# Patient Record
Sex: Female | Born: 1955 | Race: White | Hispanic: No | Marital: Single | Smoking: Former smoker
Health system: Southern US, Community
[De-identification: ages and names within clinical notes are randomized; demographics above are authoritative.]

## PROBLEM LIST (undated history)

## (undated) DIAGNOSIS — Z8709 Personal history of other diseases of the respiratory system: Secondary | ICD-10-CM

## (undated) DIAGNOSIS — M1990 Unspecified osteoarthritis, unspecified site: Secondary | ICD-10-CM

## (undated) DIAGNOSIS — F419 Anxiety disorder, unspecified: Secondary | ICD-10-CM

## (undated) DIAGNOSIS — K649 Unspecified hemorrhoids: Secondary | ICD-10-CM

## (undated) DIAGNOSIS — E786 Lipoprotein deficiency: Secondary | ICD-10-CM

## (undated) DIAGNOSIS — R0600 Dyspnea, unspecified: Secondary | ICD-10-CM

## (undated) HISTORY — PX: COLONOSCOPY: SHX174

## (undated) HISTORY — DX: Personal history of other diseases of the respiratory system: Z87.09

## (undated) HISTORY — PX: APPENDECTOMY: SHX54

## (undated) HISTORY — DX: Unspecified hemorrhoids: K64.9

## (undated) HISTORY — DX: Lipoprotein deficiency: E78.6

---

## 2001-05-12 ENCOUNTER — Other Ambulatory Visit: Admission: RE | Admit: 2001-05-12 | Discharge: 2001-05-12 | Payer: Self-pay | Admitting: Family Medicine

## 2005-03-08 HISTORY — PX: DILATION AND CURETTAGE OF UTERUS: SHX78

## 2005-03-08 HISTORY — PX: ENDOMETRIAL ABLATION: SHX621

## 2010-01-14 ENCOUNTER — Other Ambulatory Visit: Admission: RE | Admit: 2010-01-14 | Discharge: 2010-01-14 | Payer: Self-pay | Admitting: Family Medicine

## 2011-10-08 ENCOUNTER — Other Ambulatory Visit: Payer: Self-pay | Admitting: Family Medicine

## 2011-10-08 ENCOUNTER — Ambulatory Visit
Admission: RE | Admit: 2011-10-08 | Discharge: 2011-10-08 | Disposition: A | Discharge status: Home / Self Care | Payer: 59 | Source: Ambulatory Visit | Attending: Family Medicine | Admitting: Family Medicine

## 2011-10-08 DIAGNOSIS — R1011 Right upper quadrant pain: Secondary | ICD-10-CM

## 2014-04-06 ENCOUNTER — Encounter: Payer: Self-pay | Admitting: *Deleted

## 2016-03-08 HISTORY — PX: BREAST BIOPSY: SHX20

## 2016-03-15 ENCOUNTER — Other Ambulatory Visit: Payer: Self-pay | Admitting: Family Medicine

## 2016-03-15 ENCOUNTER — Other Ambulatory Visit (HOSPITAL_COMMUNITY)
Admission: RE | Admit: 2016-03-15 | Discharge: 2016-03-15 | Disposition: A | Discharge status: Home / Self Care | Payer: 59 | Source: Ambulatory Visit | Attending: Family Medicine | Admitting: Family Medicine

## 2016-03-15 DIAGNOSIS — R7309 Other abnormal glucose: Secondary | ICD-10-CM | POA: Diagnosis not present

## 2016-03-15 DIAGNOSIS — Z1151 Encounter for screening for human papillomavirus (HPV): Secondary | ICD-10-CM | POA: Diagnosis not present

## 2016-03-15 DIAGNOSIS — Z124 Encounter for screening for malignant neoplasm of cervix: Secondary | ICD-10-CM | POA: Diagnosis not present

## 2016-03-15 DIAGNOSIS — E559 Vitamin D deficiency, unspecified: Secondary | ICD-10-CM | POA: Diagnosis not present

## 2016-03-15 DIAGNOSIS — E785 Hyperlipidemia, unspecified: Secondary | ICD-10-CM | POA: Diagnosis not present

## 2016-03-15 DIAGNOSIS — Z0000 Encounter for general adult medical examination without abnormal findings: Secondary | ICD-10-CM | POA: Diagnosis not present

## 2016-03-16 ENCOUNTER — Ambulatory Visit
Admission: RE | Admit: 2016-03-16 | Discharge: 2016-03-16 | Disposition: A | Discharge status: Home / Self Care | Payer: 59 | Source: Ambulatory Visit | Attending: Family Medicine | Admitting: Family Medicine

## 2016-03-16 ENCOUNTER — Other Ambulatory Visit: Payer: Self-pay | Admitting: Family Medicine

## 2016-03-16 DIAGNOSIS — R222 Localized swelling, mass and lump, trunk: Secondary | ICD-10-CM | POA: Diagnosis not present

## 2016-03-16 DIAGNOSIS — R229 Localized swelling, mass and lump, unspecified: Secondary | ICD-10-CM

## 2016-03-17 ENCOUNTER — Other Ambulatory Visit: Payer: Self-pay | Admitting: Family Medicine

## 2016-03-17 DIAGNOSIS — N63 Unspecified lump in unspecified breast: Secondary | ICD-10-CM

## 2016-03-19 LAB — CYTOLOGY - PAP: HPV: NOT DETECTED

## 2016-03-23 ENCOUNTER — Ambulatory Visit
Admission: RE | Admit: 2016-03-23 | Discharge: 2016-03-23 | Disposition: A | Discharge status: Home / Self Care | Payer: 59 | Source: Ambulatory Visit | Attending: Family Medicine | Admitting: Family Medicine

## 2016-03-23 ENCOUNTER — Other Ambulatory Visit: Payer: Self-pay | Admitting: Family Medicine

## 2016-03-23 DIAGNOSIS — R921 Mammographic calcification found on diagnostic imaging of breast: Secondary | ICD-10-CM

## 2016-03-23 DIAGNOSIS — N63 Unspecified lump in unspecified breast: Secondary | ICD-10-CM

## 2016-03-23 DIAGNOSIS — N6489 Other specified disorders of breast: Secondary | ICD-10-CM | POA: Diagnosis not present

## 2016-03-30 ENCOUNTER — Ambulatory Visit
Admission: RE | Admit: 2016-03-30 | Discharge: 2016-03-30 | Disposition: A | Discharge status: Home / Self Care | Payer: 59 | Source: Ambulatory Visit | Attending: Family Medicine | Admitting: Family Medicine

## 2016-03-30 ENCOUNTER — Other Ambulatory Visit: Payer: Self-pay | Admitting: Family Medicine

## 2016-03-30 DIAGNOSIS — R921 Mammographic calcification found on diagnostic imaging of breast: Secondary | ICD-10-CM

## 2016-03-30 DIAGNOSIS — N6012 Diffuse cystic mastopathy of left breast: Secondary | ICD-10-CM | POA: Diagnosis not present

## 2016-04-02 ENCOUNTER — Other Ambulatory Visit: Payer: Self-pay | Admitting: Obstetrics & Gynecology

## 2016-04-02 DIAGNOSIS — N72 Inflammatory disease of cervix uteri: Secondary | ICD-10-CM | POA: Diagnosis not present

## 2016-04-02 DIAGNOSIS — R87619 Unspecified abnormal cytological findings in specimens from cervix uteri: Secondary | ICD-10-CM | POA: Diagnosis not present

## 2016-04-29 DIAGNOSIS — R87619 Unspecified abnormal cytological findings in specimens from cervix uteri: Secondary | ICD-10-CM | POA: Diagnosis not present

## 2016-05-21 ENCOUNTER — Encounter (HOSPITAL_COMMUNITY): Payer: Self-pay | Admitting: *Deleted

## 2016-05-24 DIAGNOSIS — R87619 Unspecified abnormal cytological findings in specimens from cervix uteri: Secondary | ICD-10-CM | POA: Diagnosis not present

## 2016-05-24 DIAGNOSIS — Z01818 Encounter for other preprocedural examination: Secondary | ICD-10-CM | POA: Diagnosis not present

## 2016-05-24 NOTE — Patient Instructions (Signed)
Your procedure is scheduled on:  Wednesday, June 02, 2016  [facility] at:  6:00 AM  Pick up the phone at the desk and dial [phone].  Call this number if you have problems the morning of surgery: [phone].  Remember: Do NOT eat food or drink after:  Midnight Tuesday  Take these medicines the morning of surgery with a SIP OF WATER:  None  Stop ALL herbal medications and Ibuprofen at this time  Do NOT smoke the day of surgery.  Do NOT wear jewelry (body piercing), metal hair clips/bobby pins, make-up, or nail polish. Do NOT wear lotions, powders, or perfumes.  You may wear deodorant. Do NOT shave for 48 hours prior to surgery. Do NOT bring valuables to the hospital. Contacts, dentures, or bridgework may not be worn into surgery.  Have a responsible adult drive you home and stay with you for 24 hours after your procedure  Bring a copy of your healthcare power of attorney and living will documents.

## 2016-05-25 ENCOUNTER — Encounter (HOSPITAL_COMMUNITY): Payer: Self-pay

## 2016-05-25 ENCOUNTER — Encounter (HOSPITAL_COMMUNITY)
Admission: RE | Admit: 2016-05-25 | Discharge: 2016-05-25 | Disposition: A | Discharge status: Home / Self Care | Payer: 59 | Source: Ambulatory Visit | Attending: Obstetrics & Gynecology | Admitting: Obstetrics & Gynecology

## 2016-05-25 DIAGNOSIS — R87619 Unspecified abnormal cytological findings in specimens from cervix uteri: Secondary | ICD-10-CM | POA: Insufficient documentation

## 2016-05-25 DIAGNOSIS — Z01812 Encounter for preprocedural laboratory examination: Secondary | ICD-10-CM | POA: Diagnosis present

## 2016-05-25 HISTORY — DX: Unspecified osteoarthritis, unspecified site: M19.90

## 2016-05-25 HISTORY — DX: Dyspnea, unspecified: R06.00

## 2016-05-25 HISTORY — DX: Anxiety disorder, unspecified: F41.9

## 2016-05-25 LAB — BASIC METABOLIC PANEL
ANION GAP: 7 (ref 5–15)
BUN: 16 mg/dL (ref 6–20)
CALCIUM: 9.4 mg/dL (ref 8.9–10.3)
CO2: 27 mmol/L (ref 22–32)
CREATININE: 0.64 mg/dL (ref 0.44–1.00)
Chloride: 107 mmol/L (ref 101–111)
Glucose, Bld: 103 mg/dL — ABNORMAL HIGH (ref 65–99)
Potassium: 4.1 mmol/L (ref 3.5–5.1)
SODIUM: 141 mmol/L (ref 135–145)

## 2016-05-25 LAB — CBC
HCT: 41.3 % (ref 36.0–46.0)
Hemoglobin: 13.4 g/dL (ref 12.0–15.0)
MCH: 30.7 pg (ref 26.0–34.0)
MCHC: 32.4 g/dL (ref 30.0–36.0)
MCV: 94.7 fL (ref 78.0–100.0)
PLATELETS: 266 10*3/uL (ref 150–400)
RBC: 4.36 MIL/uL (ref 3.87–5.11)
RDW: 13.2 % (ref 11.5–15.5)
WBC: 6.5 10*3/uL (ref 4.0–10.5)

## 2016-05-30 NOTE — H&P (Signed)
61yo PM female who [facility] due to AGC on 03/15/2016 and inability to obtain endometrial biopsy. Unfortunately EMB was not able to be collected and an US was performed that showed: 5.7cm uterus with thin lining- 0.2cm. Tiny hyperechoic area with shadowing- 0.5x0.4cm- ?calcification, avascular. Normal ovaries bilaterally. Unable to complete SHG. Today she denies any vaginal bleeding, discharge, itching or irritation. No pelvic or abdominal pain. No acute complaints or changes from her prior visit.   Current Medications  Taking   Ibuprofen 200 MG Tablet 2 tablets with food or milk as needed Orally every 6 hrs as needed for pain   Medication List reviewed and reconciled with the patient    Past Medical History  H/o recurrent bronchitis.   Hypoercholesterolemia.   Hemorrhoids, seen on colonoscopy.   Soft tissue knot left forehead since childhood.           Surgical History  appendectomy   D&C 07  endometrial ablation 07  breast biopsy, benign fibrocystic change 1/18   Family History  Father: deceased 7073 yrs, diabetes, heart attack, cholesterol--died of massive MI, had an enlarged heart, HH, gout,   Mother: alive 1782 yrs, OA, fibroids, ? female cancer  Paternal Grand Father: deceased, heart attack  Paternal Grand Mother: deceased, diabetes  Maternal Grand Father: deceased, COPD  Maternal Grand Mother: deceased, heart attack  Brother 1: deceased 50's yrs, Heart attack  Brother2: alive  Sister 1: alive  2 brother(s) , 1 sister(s) .   no family hx colon cancer, colon polyps or liver disease.   Social History  General:  Tobacco use  cigarettes: Former smoker Quit in year 2011 Pack-year Hx: 15 Tobacco history last updated 05/24/2016 no EXPOSURE TO PASSIVE SMOKE.  Alcohol: yes, maybe 1 wine or been once a week at most.  no Recreational drug use.  Exercise: yes, walks, daily 2-4 miles daily, some hiking, as weather allows.  Marital Status: single, no relationship  currently, previous female partners.  Children: none.  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  Religion: Christian--[facility] [address]  Seat belt use: yes.  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    Gyn History  Sexual activity not currently sexually active.  Periods : postmenopausal.  Last pap smear date 03/15/16 - [facility].  Last mammogram date 03/30/16 mammogram with left breast bx.  Abnormal pap smear 03/15/16 - [facility] late 40's or 50's-abnormal pap.  Denies H/O STD.  Menarche 10.    OB History  Never been pregnant per patient.    Allergies  Codeine Sulfate: nausea   Hospitalization/Major Diagnostic Procedure  appendectomy    Review of Systems  CONSTITUTIONAL:  no Chills. no Fever. no Night sweats.  HEENT:  Blurrred vision no. no Double vision.  CARDIOLOGY:  no Chest pain.  RESPIRATORY:  no Shortness of breath. no Cough.  UROLOGY:  no Urinary frequency. no Urinary incontinence. no Urinary urgency.  GASTROENTEROLOGY:  no Abdominal pain. no Appetite change. no Change in bowel movements.  FEMALE REPRODUCTIVE:  no Breast lumps or discharge. no Breast pain.  NEUROLOGY:  no Dizziness. no Headache. no Loss of consciousness.  PSYCHOLOGY:  no Anxiety. no Depression.  SKIN:  no Rash. no Hives.  HEMATOLOGY/LYMPH:  no Anemia. no Fatigue. Using Blood Thinners no.     Performed in office Vital Signs  Wt 188, Wt change -2 lb, Ht 61, BMI 35.52, Pulse sitting 77, BP sitting 132/77.  Examination  General Examination: GENERAL APPEARANCE well developed, well nourished .  SKIN: warm and dry, no rashes .  NECK: supple, normal appearance .  LUNGS: clear to auscultation bilaterally, no wheezes, rhonchi, rales.  HEART: no murmurs, regular rate and rhythm.  ABDOMEN: soft and not tender, no rebound, no rigidity.  MUSCULOSKELETAL no calf tenderness bilaterally .  EXTREMITIES: no edema present .  PSYCH:  appropriate mood and affect .     A/P: 61yo PM female who presents for hysteroscopy, D&C -NPO -LR @ 125cc/hr -SCDs to OR -no antibiotics indicated -Risk/benefits reviewed with patient including but not limited to risk of bleeding, infection, and injury to surrounding organs including uterine perforation.  Question and concerns were addressed and pt wishes to proceed.  Myna Hidalgo, DO [phone] (pager) [phone] (office)

## 2016-06-01 NOTE — Anesthesia Preprocedure Evaluation (Addendum)
Anesthesia Evaluation  Patient identified by MRN, date of birth, ID band Patient awake    Reviewed: Allergy & Precautions, NPO status , Patient's Chart, lab work & pertinent test results  Airway Mallampati: II  TM Distance: >3 FB Neck ROM: Full    Dental no notable dental hx.    Pulmonary neg pulmonary ROS, former smoker,    Pulmonary exam normal breath sounds clear to auscultation       Cardiovascular negative cardio ROS Normal cardiovascular exam Rhythm:Regular Rate:Normal     Neuro/Psych negative neurological ROS  negative psych ROS   GI/Hepatic negative GI ROS, Neg liver ROS,   Endo/Other  negative endocrine ROS  Renal/GU negative Renal ROS  negative genitourinary   Musculoskeletal negative musculoskeletal ROS (+)   Abdominal   Peds negative pediatric ROS (+)  Hematology negative hematology ROS (+)   Anesthesia Other Findings   Reproductive/Obstetrics negative OB ROS                            Anesthesia Physical Anesthesia Plan  ASA: II  Anesthesia Plan: General   Post-op Pain Management:    Induction: Intravenous  Airway Management Planned: LMA  Additional Equipment:   Intra-op Plan:   Post-operative Plan: Extubation in OR  Informed Consent: I have reviewed the patients History and Physical, chart, labs and discussed the procedure including the risks, benefits and alternatives for the proposed anesthesia with the patient or authorized representative who has indicated his/her understanding and acceptance.   Dental advisory given  Plan Discussed with: CRNA  Anesthesia Plan Comments:         Anesthesia Quick Evaluation  

## 2016-06-02 ENCOUNTER — Ambulatory Visit (HOSPITAL_COMMUNITY)
Admission: RE | Admit: 2016-06-02 | Discharge: 2016-06-02 | Disposition: A | Discharge status: Home / Self Care | Payer: 59 | Source: Ambulatory Visit | Attending: Obstetrics & Gynecology | Admitting: Obstetrics & Gynecology

## 2016-06-02 ENCOUNTER — Encounter (HOSPITAL_COMMUNITY)
Admission: RE | Disposition: A | Discharge status: Home / Self Care | Payer: Self-pay | Source: Ambulatory Visit | Attending: Obstetrics & Gynecology

## 2016-06-02 ENCOUNTER — Encounter (HOSPITAL_COMMUNITY): Payer: Self-pay

## 2016-06-02 ENCOUNTER — Ambulatory Visit (HOSPITAL_COMMUNITY): Payer: 59 | Admitting: Anesthesiology

## 2016-06-02 DIAGNOSIS — R87610 Atypical squamous cells of undetermined significance on cytologic smear of cervix (ASC-US): Secondary | ICD-10-CM | POA: Insufficient documentation

## 2016-06-02 DIAGNOSIS — Z87891 Personal history of nicotine dependence: Secondary | ICD-10-CM | POA: Insufficient documentation

## 2016-06-02 DIAGNOSIS — N882 Stricture and stenosis of cervix uteri: Secondary | ICD-10-CM | POA: Insufficient documentation

## 2016-06-02 DIAGNOSIS — R87619 Unspecified abnormal cytological findings in specimens from cervix uteri: Secondary | ICD-10-CM | POA: Diagnosis present

## 2016-06-02 HISTORY — PX: HYSTEROSCOPY WITH D & C: SHX1775

## 2016-06-02 SURGERY — DILATATION AND CURETTAGE /HYSTEROSCOPY
Anesthesia: General | Site: Vagina

## 2016-06-02 MED ORDER — LACTATED RINGERS IV SOLN
INTRAVENOUS | Status: DC
  Administered 2016-06-02: 07:00:00 via INTRAVENOUS

## 2016-06-02 MED ORDER — DEXAMETHASONE SODIUM PHOSPHATE 10 MG/ML IJ SOLN
INTRAMUSCULAR | Status: DC | PRN
  Administered 2016-06-02: 4 mg via INTRAVENOUS

## 2016-06-02 MED ORDER — PROPOFOL 10 MG/ML IV BOLUS
INTRAVENOUS | Status: DC | PRN
  Administered 2016-06-02: 170 mg via INTRAVENOUS

## 2016-06-02 MED ORDER — LACTATED RINGERS IV SOLN: INTRAVENOUS | Status: DC

## 2016-06-02 MED ORDER — LIDOCAINE HCL (CARDIAC) 20 MG/ML IV SOLN
INTRAVENOUS | Status: DC | PRN
  Administered 2016-06-02: 50 mg via INTRAVENOUS

## 2016-06-02 MED ORDER — FENTANYL CITRATE (PF) 100 MCG/2ML IJ SOLN
INTRAMUSCULAR | Status: AC
  Filled 2016-06-02: qty 2

## 2016-06-02 MED ORDER — FENTANYL CITRATE (PF) 100 MCG/2ML IJ SOLN
INTRAMUSCULAR | Status: DC | PRN
  Administered 2016-06-02 (×2): 25 ug via INTRAVENOUS
  Administered 2016-06-02: 50 ug via INTRAVENOUS

## 2016-06-02 MED ORDER — DEXAMETHASONE SODIUM PHOSPHATE 4 MG/ML IJ SOLN
INTRAMUSCULAR | Status: AC
Start: 2016-06-02 — End: ?
  Filled 2016-06-02: qty 1

## 2016-06-02 MED ORDER — ONDANSETRON HCL 4 MG/2ML IJ SOLN
INTRAMUSCULAR | Status: DC | PRN
  Administered 2016-06-02: 4 mg via INTRAVENOUS

## 2016-06-02 MED ORDER — MIDAZOLAM HCL 2 MG/2ML IJ SOLN
INTRAMUSCULAR | Status: AC
  Filled 2016-06-02: qty 2

## 2016-06-02 MED ORDER — SODIUM CHLORIDE 0.9 % IR SOLN
Status: DC | PRN
  Administered 2016-06-02: 3000 mL

## 2016-06-02 MED ORDER — KETOROLAC TROMETHAMINE 30 MG/ML IJ SOLN
INTRAMUSCULAR | Status: DC | PRN
  Administered 2016-06-02: 30 mg via INTRAVENOUS

## 2016-06-02 MED ORDER — SCOPOLAMINE 1 MG/3DAYS TD PT72
MEDICATED_PATCH | TRANSDERMAL | Status: AC
  Filled 2016-06-02: qty 1

## 2016-06-02 MED ORDER — SILVER NITRATE-POT NITRATE 75-25 % EX MISC
CUTANEOUS | Status: DC | PRN
  Administered 2016-06-02: 1

## 2016-06-02 MED ORDER — SCOPOLAMINE 1 MG/3DAYS TD PT72
1.0000 | MEDICATED_PATCH | Freq: Once | TRANSDERMAL | Status: DC
  Administered 2016-06-02: 1.5 mg via TRANSDERMAL

## 2016-06-02 MED ORDER — PROPOFOL 10 MG/ML IV BOLUS
INTRAVENOUS | Status: AC
  Filled 2016-06-02: qty 20

## 2016-06-02 MED ORDER — ONDANSETRON HCL 4 MG/2ML IJ SOLN
INTRAMUSCULAR | Status: AC
Start: 2016-06-02 — End: ?
  Filled 2016-06-02: qty 2

## 2016-06-02 MED ORDER — MIDAZOLAM HCL 2 MG/2ML IJ SOLN
INTRAMUSCULAR | Status: DC | PRN
  Administered 2016-06-02: 2 mg via INTRAVENOUS

## 2016-06-02 SURGICAL SUPPLY — 16 items
CANISTER SUCT 3000ML PPV (MISCELLANEOUS) ×2 IMPLANT
CATH ROBINSON RED A/P 16FR (CATHETERS) ×2 IMPLANT
CLOTH BEACON ORANGE TIMEOUT ST (SAFETY) ×2 IMPLANT
CONTAINER PREFILL 10% NBF 60ML (FORM) ×4 IMPLANT
DILATOR CANAL MILEX (MISCELLANEOUS) ×1 IMPLANT
GLOVE BIOGEL PI IND STRL 6.5 (GLOVE) ×1 IMPLANT
GLOVE BIOGEL PI IND STRL 7.0 (GLOVE) ×1 IMPLANT
GLOVE BIOGEL PI INDICATOR 6.5 (GLOVE) ×1
GLOVE BIOGEL PI INDICATOR 7.0 (GLOVE) ×1
GLOVE ECLIPSE 6.5 STRL STRAW (GLOVE) ×2 IMPLANT
GOWN STRL REUS W/TWL LRG LVL3 (GOWN DISPOSABLE) ×4 IMPLANT
PACK VAGINAL MINOR WOMEN LF (CUSTOM PROCEDURE TRAY) ×2 IMPLANT
PAD OB MATERNITY 4.3X12.25 (PERSONAL CARE ITEMS) ×2 IMPLANT
TOWEL OR 17X24 6PK STRL BLUE (TOWEL DISPOSABLE) ×4 IMPLANT
TUBING AQUILEX INFLOW (TUBING) ×2 IMPLANT
TUBING AQUILEX OUTFLOW (TUBING) ×2 IMPLANT

## 2016-06-02 NOTE — Op Note (Addendum)
Operative Report  PreOp: Atypical glandular cells PostOp: same Procedure:  Hysteroscopy, Dilation and Curettage Surgeon: Dr. Myna HidalgoJennifer Alixis Archer Anesthesia: General Complications:none EBL: Minimal (5cc) UOP: 50cc IVF:750cc Discrepancy: 70cc  Findings: 5cm anteverted uterus slightly thickened lining  Specimens: 1) endometrial curettings   Procedure: The patient was taken to the operating room where she underwent general anesthesia without difficulty. The patient was placed in a low lithotomy position using Allen stirrups. She was then prepped and draped in the normal sterile fashion. The bladder was drained using a red rubber urethral catheter. A sterile speculum was inserted into the vagina. A single tooth tenaculum was placed on the anterior lip of the cervix. The uterus was then sounded to 5cm. The endocervical canal was then serially dilated using Hank dilators.  The diagnostic hysteroscope was then inserted without difficulty and noted to have the findings as listed above. Visualization was achieved using NS as a distending medium. The hysteroscope was removed and sharp curettage was performed. The tissue was sent to pathology.   Upon completion, the hysteroscope was reinserted, no uterine perforation was seen. All instrument were then removed. Hemostasis was obtained at the cervical site using silver nitrate.  The patient was repositioned to the supine position. The patient tolerated the procedure without any complications and taken to recovery in stable condition.   Myna HidalgoJennifer Ami Mally, DO [phone] (pager) [phone] (office)

## 2016-06-02 NOTE — Anesthesia Postprocedure Evaluation (Signed)
Anesthesia Post Note  Patient: Donna Archer  Procedure(s) Performed: Procedure(s) (LRB): DILATATION AND CURETTAGE /HYSTEROSCOPY (N/A)  Patient location during evaluation: PACU Anesthesia Type: General Level of consciousness: awake and alert Pain management: pain level controlled Vital Signs Assessment: post-procedure vital signs reviewed and stable Respiratory status: spontaneous breathing, nonlabored ventilation, respiratory function stable and patient connected to nasal cannula oxygen Cardiovascular status: blood pressure returned to baseline and stable Postop Assessment: no signs of nausea or vomiting Anesthetic complications: no        Last Vitals:  Vitals:   06/02/16 0900 06/02/16 0954  BP: 119/69 138/89  Pulse: 72 68  Resp: (!) 21 20  Temp: 36.8 C     Last Pain:  Vitals:   06/02/16 0604  TempSrc: Oral   Pain Goal: Patients Stated Pain Goal: 4 (06/02/16 0604)               Phillips Groutarignan, Jerrion Tabbert

## 2016-06-02 NOTE — Transfer of Care (Signed)
Immediate Anesthesia Transfer of Care Note  Patient: Donna CheadleJocelyn Archer  Procedure(s) Performed: Procedure(s): DILATATION AND CURETTAGE /HYSTEROSCOPY (N/A)  Patient Location: PACU  Anesthesia Type:General  Level of Consciousness: awake, alert  and oriented  Airway & Oxygen Therapy: Patient Spontanous Breathing and Patient connected to nasal cannula oxygen  Post-op Assessment: Report given to RN and Post -op Vital signs reviewed and stable  Post vital signs: Reviewed and stable  Last Vitals:    Last Pain:  Vitals:   06/02/16 0604  TempSrc: Oral      Patients Stated Pain Goal: 4 (06/02/16 0604)  Complications: No apparent anesthesia complications

## 2016-06-02 NOTE — Interval H&P Note (Signed)
History and Physical Interval Note:  06/02/2016 6:36 AM  Delman CheadleJocelyn Sangalang  has presented today for surgery, with the diagnosis of R87.619 Atypical Cervical Glandular cells N88.2 Cervical Stenosis  The various methods of treatment have been discussed with the patient and family. After consideration of risks, benefits and other options for treatment, the patient has consented to  Procedure(s): DILATATION AND CURETTAGE /HYSTEROSCOPY (N/A) as a surgical intervention .  The patient's history has been reviewed, patient examined, no change in status, stable for surgery.  I have reviewed the patient's chart and labs.  Questions were answered to the patient's satisfaction.     Myna HidalgoZAN, Lovelle Deitrick, M

## 2016-06-02 NOTE — Discharge Instructions (Addendum)
HOME INSTRUCTIONS  Please note any unusual or excessive bleeding, pain, swelling. Mild dizziness or drowsiness are normal for about 24 hours after surgery.   Shower when comfortable  Restrictions: No driving for 24 hours or while taking pain medications.  Activity:  No heavy lifting (> 10 lbs), nothing in vagina (no tampons, douching, or intercourse) x 2 weeks; no tub baths for 2 weeks Vaginal spotting is expected but if your bleeding is heavy, period like,  please call the office  Diet:  You may return to your regular diet.  Do not eat large meals.  Eat small frequent meals throughout the day.  Continue to drink a good amount of water at least 6-8 glasses of water per day, hydration is very important for the healing process.    Post Anesthesia Home Care Instructions  Activity: Get plenty of rest for the remainder of the day. A responsible individual must stay with you for 24 hours following the procedure.  For the next 24 hours, DO NOT: -Drive a car -Advertising copywriterperate machinery -Drink alcoholic beverages -Take any medication unless instructed by your physician -Make any legal decisions or sign important papers.  Meals: Start with liquid foods such as gelatin or soup. Progress to regular foods as tolerated. Avoid greasy, spicy, heavy foods. If nausea and/or vomiting occur, drink only clear liquids until the nausea and/or vomiting subsides. Call your physician if vomiting continues.  Special Instructions/Symptoms: Your throat may feel dry or sore from the anesthesia or the breathing tube placed in your throat during surgery. If this causes discomfort, gargle with warm salt water. The discomfort should disappear within 24 hours.  If you had a scopolamine patch placed behind your ear for the management of post- operative nausea and/or vomiting:  1. The medication in the patch is effective for 72 hours, after which it should be removed.  Wrap patch in a tissue and discard in the trash. Wash hands  thoroughly with soap and water. 2. You may remove the patch earlier than 72 hours if you experience unpleasant side effects which may include dry mouth, dizziness or visual disturbances. 3. Avoid touching the patch. Wash your hands with soap and water after contact with the patch.     Pain Management: Take Motrin and/or Tylenol as needed for pain/discomfort.   Alcohol -- Avoid for 24 hours and while taking pain medications.  Nausea: Take sips of ginger ale or soda  Fever -- Call physician if temperature over 101 degrees  Follow up:  If you do not already have a follow up appointment scheduled, please call the office at [phone].  If you experience fever (a temperature greater than 100.4), pain unrelieved by pain medication, shortness of breath, swelling of a single leg, or any other symptoms which are concerning to you please the office immediately.

## 2016-06-02 NOTE — Anesthesia Procedure Notes (Signed)
Procedure Name: LMA Insertion Date/Time: 06/02/2016 7:37 AM Performed by: Cleda ClarksBROWDER, Merle Whitehorn R Pre-anesthesia Checklist: Patient identified, Emergency Drugs available, Suction available and Patient being monitored Patient Re-evaluated:Patient Re-evaluated prior to inductionOxygen Delivery Method: Circle system utilized Preoxygenation: Pre-oxygenation with 100% oxygen Intubation Type: IV induction Ventilation: Mask ventilation without difficulty LMA: LMA inserted LMA Size: 4.0 Number of attempts: 1 Placement Confirmation: positive ETCO2 Tube secured with: Tape Dental Injury: Teeth and Oropharynx as per pre-operative assessment

## 2016-06-02 NOTE — Addendum Note (Signed)
Addendum  created 06/02/16 1520 by Algis GreenhouseLinda A Siria Calandro, CRNA   Anesthesia Event edited

## 2016-06-03 ENCOUNTER — Encounter (HOSPITAL_COMMUNITY): Payer: Self-pay | Admitting: Obstetrics & Gynecology

## 2016-06-15 DIAGNOSIS — E785 Hyperlipidemia, unspecified: Secondary | ICD-10-CM | POA: Diagnosis not present

## 2016-07-01 DIAGNOSIS — R2242 Localized swelling, mass and lump, left lower limb: Secondary | ICD-10-CM | POA: Diagnosis not present

## 2016-07-26 ENCOUNTER — Other Ambulatory Visit: Payer: Self-pay | Admitting: General Surgery

## 2016-07-26 DIAGNOSIS — L720 Epidermal cyst: Secondary | ICD-10-CM | POA: Diagnosis not present

## 2016-07-26 DIAGNOSIS — L723 Sebaceous cyst: Secondary | ICD-10-CM | POA: Diagnosis not present

## 2016-08-13 DIAGNOSIS — Z4800 Encounter for change or removal of nonsurgical wound dressing: Secondary | ICD-10-CM | POA: Diagnosis not present

## 2016-08-13 DIAGNOSIS — R222 Localized swelling, mass and lump, trunk: Secondary | ICD-10-CM | POA: Diagnosis not present

## 2016-08-20 DIAGNOSIS — S50851A Superficial foreign body of right forearm, initial encounter: Secondary | ICD-10-CM | POA: Diagnosis not present

## 2016-10-14 DIAGNOSIS — Z0100 Encounter for examination of eyes and vision without abnormal findings: Secondary | ICD-10-CM | POA: Diagnosis not present

## 2016-11-29 ENCOUNTER — Other Ambulatory Visit: Payer: Self-pay | Admitting: Family Medicine

## 2016-11-29 DIAGNOSIS — N63 Unspecified lump in unspecified breast: Secondary | ICD-10-CM

## 2016-12-13 ENCOUNTER — Ambulatory Visit
Admission: RE | Admit: 2016-12-13 | Discharge: 2016-12-13 | Disposition: A | Discharge status: Home / Self Care | Payer: 59 | Source: Ambulatory Visit | Attending: Family Medicine | Admitting: Family Medicine

## 2016-12-13 DIAGNOSIS — N63 Unspecified lump in unspecified breast: Secondary | ICD-10-CM

## 2016-12-13 DIAGNOSIS — N6489 Other specified disorders of breast: Secondary | ICD-10-CM | POA: Diagnosis not present

## 2016-12-13 DIAGNOSIS — R921 Mammographic calcification found on diagnostic imaging of breast: Secondary | ICD-10-CM | POA: Diagnosis not present

## 2016-12-20 DIAGNOSIS — R52 Pain, unspecified: Secondary | ICD-10-CM | POA: Diagnosis not present

## 2016-12-20 DIAGNOSIS — B9789 Other viral agents as the cause of diseases classified elsewhere: Secondary | ICD-10-CM | POA: Diagnosis not present

## 2016-12-20 DIAGNOSIS — J069 Acute upper respiratory infection, unspecified: Secondary | ICD-10-CM | POA: Diagnosis not present

## 2017-02-01 DIAGNOSIS — Z23 Encounter for immunization: Secondary | ICD-10-CM | POA: Diagnosis not present

## 2017-04-08 DIAGNOSIS — Z01419 Encounter for gynecological examination (general) (routine) without abnormal findings: Secondary | ICD-10-CM | POA: Diagnosis not present

## 2017-05-13 ENCOUNTER — Other Ambulatory Visit: Payer: Self-pay | Admitting: Family Medicine

## 2017-05-13 DIAGNOSIS — Z1231 Encounter for screening mammogram for malignant neoplasm of breast: Secondary | ICD-10-CM

## 2017-06-02 ENCOUNTER — Ambulatory Visit
Admission: RE | Admit: 2017-06-02 | Discharge: 2017-06-02 | Disposition: A | Discharge status: Home / Self Care | Payer: 59 | Source: Ambulatory Visit | Attending: Family Medicine | Admitting: Family Medicine

## 2017-06-02 DIAGNOSIS — Z1231 Encounter for screening mammogram for malignant neoplasm of breast: Secondary | ICD-10-CM

## 2017-09-19 DIAGNOSIS — M25532 Pain in left wrist: Secondary | ICD-10-CM | POA: Diagnosis not present

## 2017-09-19 DIAGNOSIS — S60222A Contusion of left hand, initial encounter: Secondary | ICD-10-CM | POA: Diagnosis not present

## 2017-09-19 DIAGNOSIS — R0781 Pleurodynia: Secondary | ICD-10-CM | POA: Diagnosis not present

## 2017-10-05 DIAGNOSIS — S63502A Unspecified sprain of left wrist, initial encounter: Secondary | ICD-10-CM | POA: Diagnosis not present

## 2017-10-05 DIAGNOSIS — S20212A Contusion of left front wall of thorax, initial encounter: Secondary | ICD-10-CM | POA: Diagnosis not present

## 2017-12-16 DIAGNOSIS — L258 Unspecified contact dermatitis due to other agents: Secondary | ICD-10-CM | POA: Diagnosis not present

## 2018-01-19 ENCOUNTER — Other Ambulatory Visit: Payer: Self-pay | Admitting: Family Medicine

## 2018-01-19 ENCOUNTER — Ambulatory Visit
Admission: RE | Admit: 2018-01-19 | Discharge: 2018-01-19 | Disposition: A | Discharge status: Home / Self Care | Payer: 59 | Source: Ambulatory Visit | Attending: Family Medicine | Admitting: Family Medicine

## 2018-01-19 DIAGNOSIS — M25551 Pain in right hip: Secondary | ICD-10-CM

## 2018-01-19 DIAGNOSIS — M545 Low back pain: Secondary | ICD-10-CM | POA: Diagnosis not present

## 2018-02-22 DIAGNOSIS — M1611 Unilateral primary osteoarthritis, right hip: Secondary | ICD-10-CM | POA: Diagnosis not present

## 2018-02-22 DIAGNOSIS — M25551 Pain in right hip: Secondary | ICD-10-CM | POA: Diagnosis not present

## 2018-04-11 DIAGNOSIS — Z01411 Encounter for gynecological examination (general) (routine) with abnormal findings: Secondary | ICD-10-CM | POA: Diagnosis not present

## 2018-06-28 DIAGNOSIS — M1611 Unilateral primary osteoarthritis, right hip: Secondary | ICD-10-CM | POA: Diagnosis not present

## 2018-06-28 DIAGNOSIS — M25551 Pain in right hip: Secondary | ICD-10-CM | POA: Diagnosis not present

## 2018-07-28 ENCOUNTER — Other Ambulatory Visit: Payer: Self-pay | Admitting: Family Medicine

## 2018-07-28 DIAGNOSIS — Z1231 Encounter for screening mammogram for malignant neoplasm of breast: Secondary | ICD-10-CM

## 2018-09-15 ENCOUNTER — Ambulatory Visit
Admission: RE | Admit: 2018-09-15 | Discharge: 2018-09-15 | Disposition: A | Discharge status: Home / Self Care | Payer: 59 | Source: Ambulatory Visit | Attending: Family Medicine | Admitting: Family Medicine

## 2018-09-15 ENCOUNTER — Other Ambulatory Visit: Payer: Self-pay

## 2018-09-15 DIAGNOSIS — Z1231 Encounter for screening mammogram for malignant neoplasm of breast: Secondary | ICD-10-CM

## 2019-09-12 ENCOUNTER — Other Ambulatory Visit: Payer: Self-pay | Admitting: Family Medicine

## 2019-09-12 DIAGNOSIS — Z1231 Encounter for screening mammogram for malignant neoplasm of breast: Secondary | ICD-10-CM

## 2019-10-01 ENCOUNTER — Other Ambulatory Visit: Payer: Self-pay

## 2019-10-01 ENCOUNTER — Ambulatory Visit
Admission: RE | Admit: 2019-10-01 | Discharge: 2019-10-01 | Disposition: A | Discharge status: Home / Self Care | Payer: 59 | Source: Ambulatory Visit | Attending: Family Medicine | Admitting: Family Medicine

## 2019-10-01 DIAGNOSIS — Z1231 Encounter for screening mammogram for malignant neoplasm of breast: Secondary | ICD-10-CM

## 2020-04-15 ENCOUNTER — Other Ambulatory Visit: Payer: Self-pay | Admitting: Nurse Practitioner

## 2020-04-15 DIAGNOSIS — N6310 Unspecified lump in the right breast, unspecified quadrant: Secondary | ICD-10-CM

## 2020-05-30 ENCOUNTER — Ambulatory Visit
Admission: RE | Admit: 2020-05-30 | Discharge: 2020-05-30 | Disposition: A | Discharge status: Home / Self Care | Payer: 59 | Source: Ambulatory Visit | Attending: Nurse Practitioner | Admitting: Nurse Practitioner

## 2020-05-30 ENCOUNTER — Other Ambulatory Visit: Payer: Self-pay

## 2020-05-30 DIAGNOSIS — N6310 Unspecified lump in the right breast, unspecified quadrant: Secondary | ICD-10-CM

## 2021-04-17 ENCOUNTER — Other Ambulatory Visit: Payer: Self-pay | Admitting: Family Medicine

## 2021-04-17 DIAGNOSIS — Z1231 Encounter for screening mammogram for malignant neoplasm of breast: Secondary | ICD-10-CM

## 2021-04-17 DIAGNOSIS — Z01419 Encounter for gynecological examination (general) (routine) without abnormal findings: Secondary | ICD-10-CM | POA: Diagnosis not present

## 2021-05-01 ENCOUNTER — Ambulatory Visit
Admission: RE | Admit: 2021-05-01 | Discharge: 2021-05-01 | Disposition: A | Discharge status: Home / Self Care | Payer: BC Managed Care – PPO | Source: Ambulatory Visit | Attending: Family Medicine | Admitting: Family Medicine

## 2021-05-01 DIAGNOSIS — Z1231 Encounter for screening mammogram for malignant neoplasm of breast: Secondary | ICD-10-CM

## 2021-05-06 DIAGNOSIS — L821 Other seborrheic keratosis: Secondary | ICD-10-CM | POA: Diagnosis not present

## 2021-07-07 DIAGNOSIS — B356 Tinea cruris: Secondary | ICD-10-CM | POA: Diagnosis not present

## 2021-07-09 DIAGNOSIS — L298 Other pruritus: Secondary | ICD-10-CM | POA: Diagnosis not present

## 2021-07-09 DIAGNOSIS — R220 Localized swelling, mass and lump, head: Secondary | ICD-10-CM | POA: Diagnosis not present

## 2021-07-09 DIAGNOSIS — B3731 Acute candidiasis of vulva and vagina: Secondary | ICD-10-CM | POA: Diagnosis not present

## 2022-01-27 DIAGNOSIS — R062 Wheezing: Secondary | ICD-10-CM | POA: Diagnosis not present

## 2022-01-27 DIAGNOSIS — R509 Fever, unspecified: Secondary | ICD-10-CM | POA: Diagnosis not present

## 2022-01-27 DIAGNOSIS — B349 Viral infection, unspecified: Secondary | ICD-10-CM | POA: Diagnosis not present

## 2022-01-27 DIAGNOSIS — R051 Acute cough: Secondary | ICD-10-CM | POA: Diagnosis not present

## 2022-03-03 DIAGNOSIS — Z96641 Presence of right artificial hip joint: Secondary | ICD-10-CM | POA: Diagnosis not present

## 2022-04-05 ENCOUNTER — Other Ambulatory Visit: Payer: Self-pay | Admitting: Family Medicine

## 2022-04-05 DIAGNOSIS — Z1231 Encounter for screening mammogram for malignant neoplasm of breast: Secondary | ICD-10-CM

## 2022-04-19 DIAGNOSIS — H5213 Myopia, bilateral: Secondary | ICD-10-CM | POA: Diagnosis not present

## 2022-05-24 ENCOUNTER — Ambulatory Visit
Admission: RE | Admit: 2022-05-24 | Discharge: 2022-05-24 | Disposition: A | Discharge status: Home / Self Care | Payer: Medicare HMO | Source: Ambulatory Visit | Attending: Family Medicine | Admitting: Family Medicine

## 2022-05-24 DIAGNOSIS — Z1231 Encounter for screening mammogram for malignant neoplasm of breast: Secondary | ICD-10-CM

## 2022-05-31 DIAGNOSIS — L308 Other specified dermatitis: Secondary | ICD-10-CM | POA: Diagnosis not present

## 2022-06-21 DIAGNOSIS — R7309 Other abnormal glucose: Secondary | ICD-10-CM | POA: Diagnosis not present

## 2022-06-21 DIAGNOSIS — E2839 Other primary ovarian failure: Secondary | ICD-10-CM | POA: Diagnosis not present

## 2022-06-21 DIAGNOSIS — Z87410 Personal history of cervical dysplasia: Secondary | ICD-10-CM | POA: Diagnosis not present

## 2022-06-21 DIAGNOSIS — Z8249 Family history of ischemic heart disease and other diseases of the circulatory system: Secondary | ICD-10-CM | POA: Diagnosis not present

## 2022-06-21 DIAGNOSIS — Z1159 Encounter for screening for other viral diseases: Secondary | ICD-10-CM | POA: Diagnosis not present

## 2022-06-21 DIAGNOSIS — E559 Vitamin D deficiency, unspecified: Secondary | ICD-10-CM | POA: Diagnosis not present

## 2022-06-21 DIAGNOSIS — E785 Hyperlipidemia, unspecified: Secondary | ICD-10-CM | POA: Diagnosis not present

## 2022-06-21 DIAGNOSIS — R7303 Prediabetes: Secondary | ICD-10-CM | POA: Diagnosis not present

## 2022-06-21 DIAGNOSIS — Z0000 Encounter for general adult medical examination without abnormal findings: Secondary | ICD-10-CM | POA: Diagnosis not present

## 2022-06-21 DIAGNOSIS — E669 Obesity, unspecified: Secondary | ICD-10-CM | POA: Diagnosis not present

## 2022-06-23 ENCOUNTER — Other Ambulatory Visit (HOSPITAL_COMMUNITY): Payer: Self-pay | Admitting: Family Medicine

## 2022-06-23 ENCOUNTER — Other Ambulatory Visit: Payer: Self-pay | Admitting: Family Medicine

## 2022-06-23 DIAGNOSIS — E785 Hyperlipidemia, unspecified: Secondary | ICD-10-CM

## 2022-06-23 DIAGNOSIS — E2839 Other primary ovarian failure: Secondary | ICD-10-CM

## 2022-06-23 DIAGNOSIS — Z8249 Family history of ischemic heart disease and other diseases of the circulatory system: Secondary | ICD-10-CM

## 2022-08-10 ENCOUNTER — Ambulatory Visit
Admission: RE | Admit: 2022-08-10 | Discharge: 2022-08-10 | Disposition: A | Discharge status: Home / Self Care | Payer: No Typology Code available for payment source | Source: Ambulatory Visit | Attending: Family Medicine | Admitting: Family Medicine

## 2022-08-10 DIAGNOSIS — Z8249 Family history of ischemic heart disease and other diseases of the circulatory system: Secondary | ICD-10-CM

## 2022-08-10 DIAGNOSIS — E785 Hyperlipidemia, unspecified: Secondary | ICD-10-CM

## 2022-08-19 DIAGNOSIS — L986 Other infiltrative disorders of the skin and subcutaneous tissue: Secondary | ICD-10-CM | POA: Diagnosis not present

## 2022-08-19 DIAGNOSIS — L245 Irritant contact dermatitis due to other chemical products: Secondary | ICD-10-CM | POA: Diagnosis not present

## 2022-08-19 DIAGNOSIS — L308 Other specified dermatitis: Secondary | ICD-10-CM | POA: Diagnosis not present

## 2022-08-19 DIAGNOSIS — S30860A Insect bite (nonvenomous) of lower back and pelvis, initial encounter: Secondary | ICD-10-CM | POA: Diagnosis not present

## 2022-09-27 DIAGNOSIS — E785 Hyperlipidemia, unspecified: Secondary | ICD-10-CM | POA: Diagnosis not present

## 2022-10-26 DIAGNOSIS — H2513 Age-related nuclear cataract, bilateral: Secondary | ICD-10-CM | POA: Diagnosis not present

## 2022-11-03 DIAGNOSIS — L258 Unspecified contact dermatitis due to other agents: Secondary | ICD-10-CM | POA: Diagnosis not present

## 2022-11-11 IMAGING — MG MM DIGITAL SCREENING BILAT W/ TOMO AND CAD
6 of 12 series · 6 of 36 positions shown · non-contrast
Comparison: Previous exam(s).

CLINICAL DATA: Screening.

EXAM:
DIGITAL SCREENING BILATERAL MAMMOGRAM WITH TOMOSYNTHESIS AND CAD
TECHNIQUE: Bilateral screening digital craniocaudal and mediolateral oblique
mammograms were obtained. Bilateral screening digital breast
tomosynthesis was performed. The images were evaluated with
computer-aided detection.

[R CC synth-2D (1 of 2)]
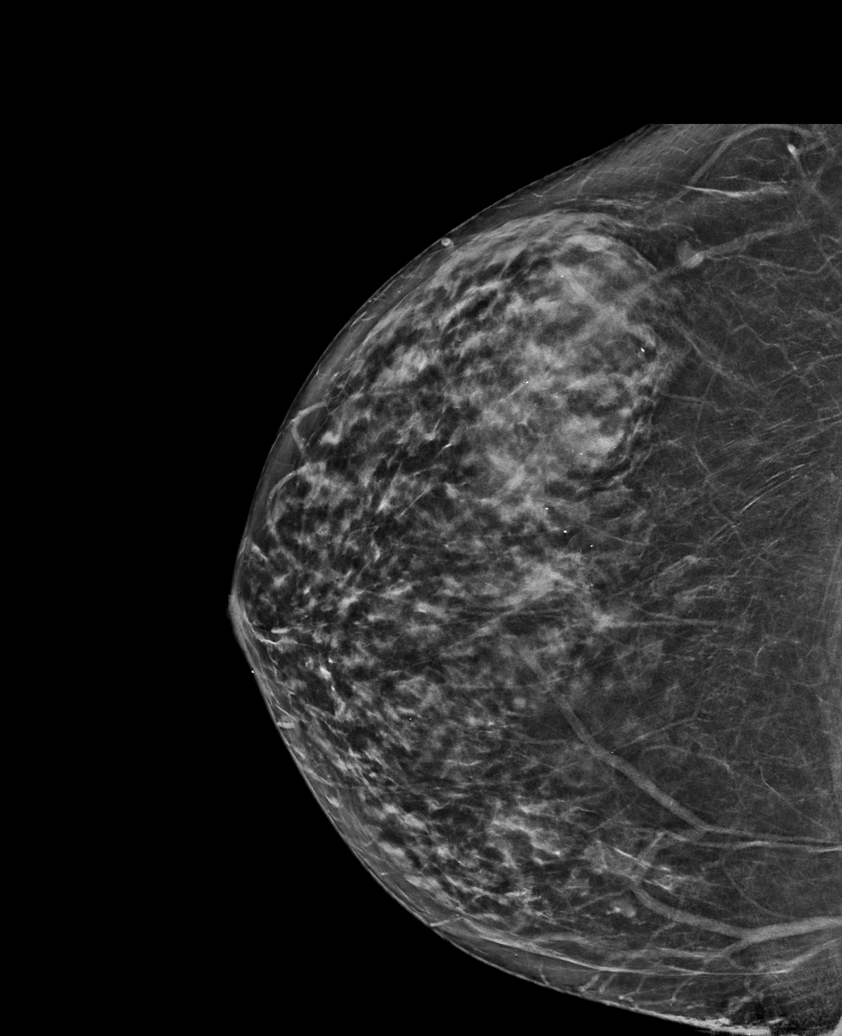

[L CC synth-2D (1 of 2)]
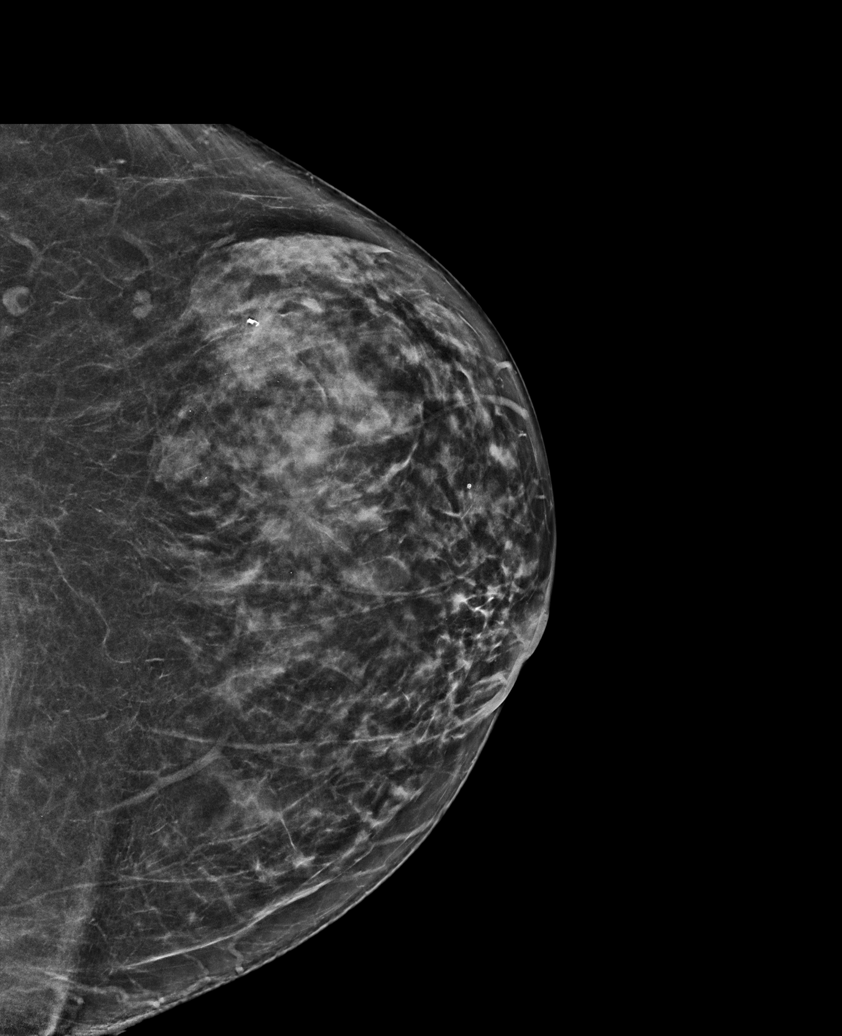

[R CC synth-2D (2 of 2)]
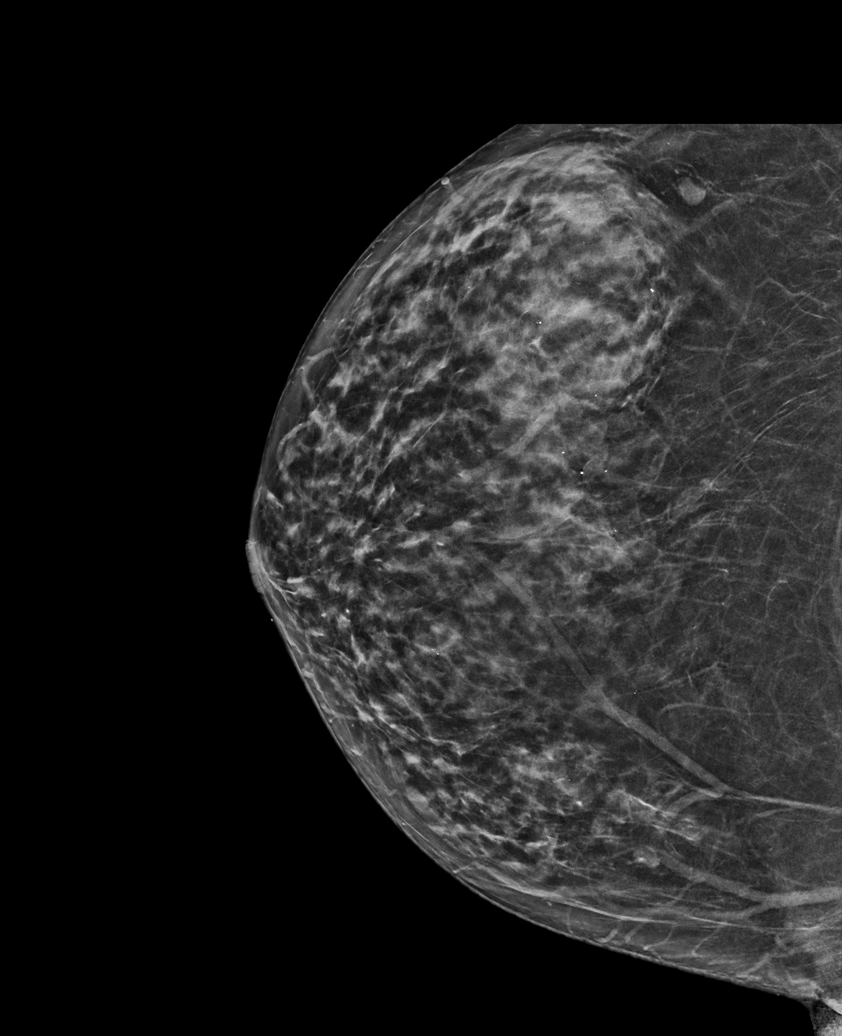

[R MLO synth-2D]
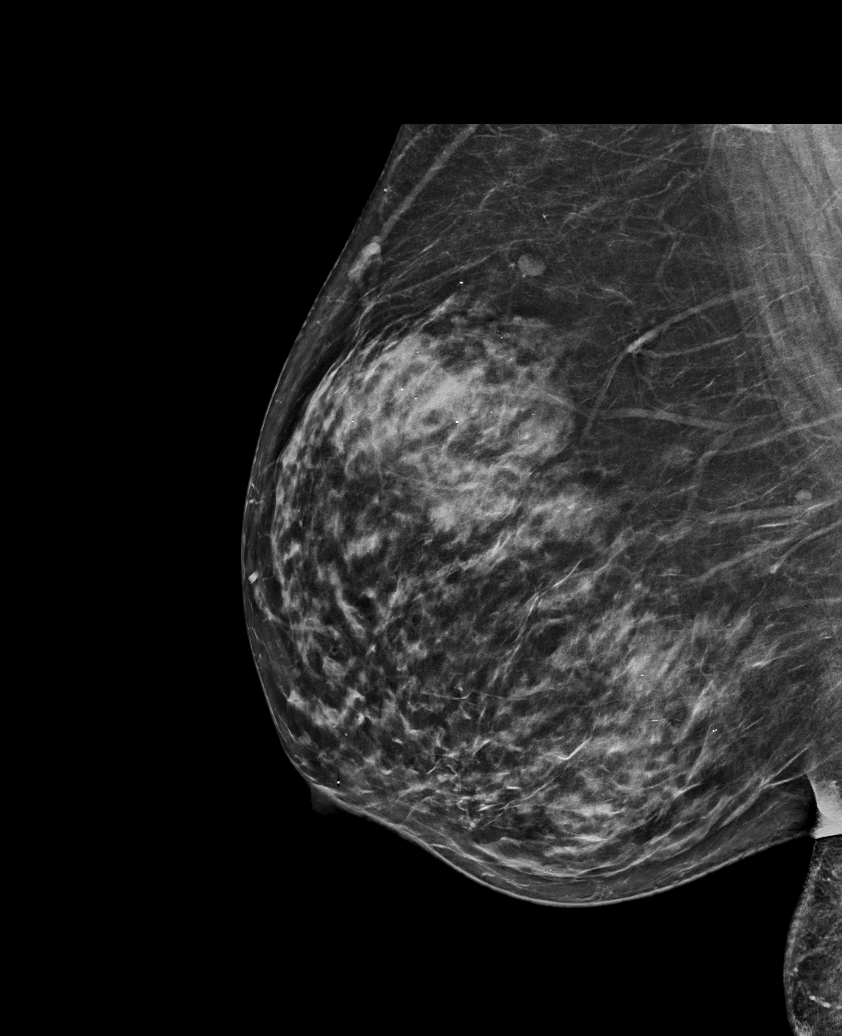

[L MLO synth-2D]
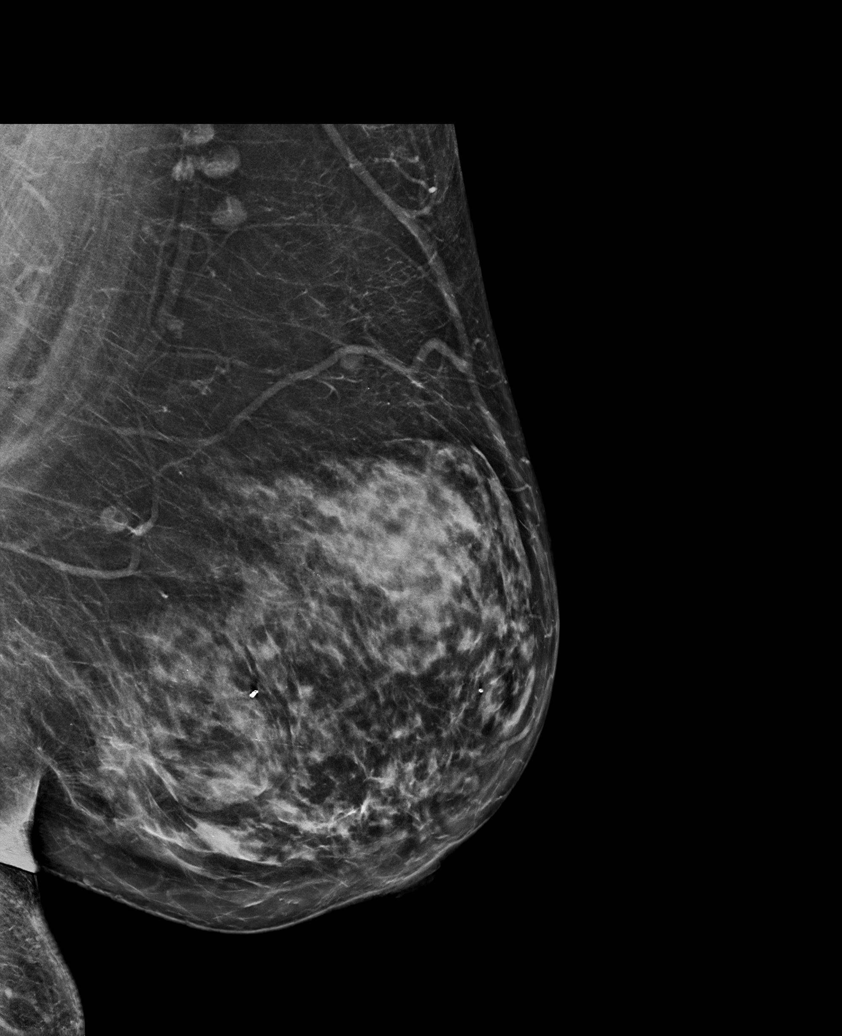

[L CC synth-2D (2 of 2)]
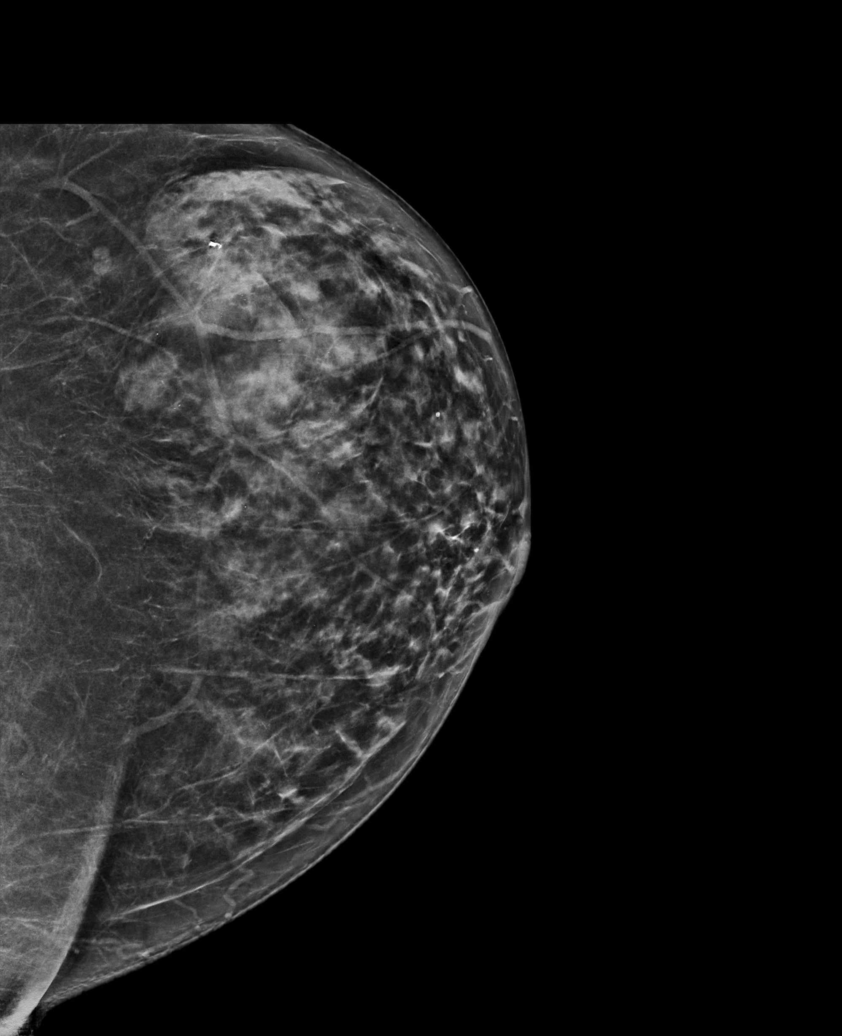

[6 of 36 positions shown; findings below may reference images not displayed]

ACR Breast Density Category c: The breast tissue is heterogeneously
dense, which may obscure small masses.
FINDINGS: There are no findings suspicious for malignancy.
IMPRESSION: No mammographic evidence of malignancy. A result letter of this
screening mammogram will be mailed directly to the patient.

RECOMMENDATION:
Screening mammogram in one year. (Code:Q3-W-BC3)

BI-RADS CATEGORY  1: Negative.

## 2022-12-01 DIAGNOSIS — E785 Hyperlipidemia, unspecified: Secondary | ICD-10-CM | POA: Diagnosis not present

## 2022-12-01 DIAGNOSIS — Z79899 Other long term (current) drug therapy: Secondary | ICD-10-CM | POA: Diagnosis not present

## 2022-12-31 ENCOUNTER — Ambulatory Visit
Admission: RE | Admit: 2022-12-31 | Discharge: 2022-12-31 | Disposition: A | Discharge status: Home / Self Care | Payer: Medicare HMO | Source: Ambulatory Visit | Attending: Family Medicine | Admitting: Family Medicine

## 2022-12-31 DIAGNOSIS — N958 Other specified menopausal and perimenopausal disorders: Secondary | ICD-10-CM | POA: Diagnosis not present

## 2022-12-31 DIAGNOSIS — E349 Endocrine disorder, unspecified: Secondary | ICD-10-CM | POA: Diagnosis not present

## 2022-12-31 DIAGNOSIS — E2839 Other primary ovarian failure: Secondary | ICD-10-CM

## 2023-08-06 DIAGNOSIS — E785 Hyperlipidemia, unspecified: Secondary | ICD-10-CM | POA: Diagnosis not present

## 2023-08-06 DIAGNOSIS — E669 Obesity, unspecified: Secondary | ICD-10-CM | POA: Diagnosis not present

## 2023-09-05 ENCOUNTER — Other Ambulatory Visit: Payer: Self-pay | Admitting: Family Medicine

## 2023-09-05 ENCOUNTER — Ambulatory Visit
Admission: RE | Admit: 2023-09-05 | Discharge: 2023-09-05 | Disposition: A | Discharge status: Home / Self Care | Source: Ambulatory Visit | Attending: Family Medicine | Admitting: Family Medicine

## 2023-09-05 DIAGNOSIS — Z1231 Encounter for screening mammogram for malignant neoplasm of breast: Secondary | ICD-10-CM | POA: Diagnosis not present

## 2023-09-10 DIAGNOSIS — R051 Acute cough: Secondary | ICD-10-CM | POA: Diagnosis not present

## 2023-09-10 DIAGNOSIS — U071 COVID-19: Secondary | ICD-10-CM | POA: Diagnosis not present

## 2023-09-10 DIAGNOSIS — R509 Fever, unspecified: Secondary | ICD-10-CM | POA: Diagnosis not present

## 2023-12-20 DIAGNOSIS — Z8249 Family history of ischemic heart disease and other diseases of the circulatory system: Secondary | ICD-10-CM | POA: Diagnosis not present

## 2023-12-20 DIAGNOSIS — E785 Hyperlipidemia, unspecified: Secondary | ICD-10-CM | POA: Diagnosis not present

## 2023-12-20 DIAGNOSIS — E559 Vitamin D deficiency, unspecified: Secondary | ICD-10-CM | POA: Diagnosis not present

## 2023-12-20 DIAGNOSIS — Z0000 Encounter for general adult medical examination without abnormal findings: Secondary | ICD-10-CM | POA: Diagnosis not present

## 2023-12-20 DIAGNOSIS — H9193 Unspecified hearing loss, bilateral: Secondary | ICD-10-CM | POA: Diagnosis not present

## 2023-12-20 DIAGNOSIS — E669 Obesity, unspecified: Secondary | ICD-10-CM | POA: Diagnosis not present

## 2023-12-20 DIAGNOSIS — R7303 Prediabetes: Secondary | ICD-10-CM | POA: Diagnosis not present

## 2023-12-20 DIAGNOSIS — Z87410 Personal history of cervical dysplasia: Secondary | ICD-10-CM | POA: Diagnosis not present

## 2024-02-08 DIAGNOSIS — H903 Sensorineural hearing loss, bilateral: Secondary | ICD-10-CM | POA: Diagnosis not present
